# Patient Record
Sex: Male | Born: 1995 | Race: Black or African American | Hispanic: No | Marital: Single | Smoking: Never smoker
Health system: Southern US, Community
[De-identification: ages and names within clinical notes are randomized; demographics above are authoritative.]

## PROBLEM LIST (undated history)

## (undated) HISTORY — PX: SHOULDER SURGERY: SHX246

---

## 2017-06-01 ENCOUNTER — Encounter (HOSPITAL_COMMUNITY): Payer: Self-pay

## 2017-06-01 ENCOUNTER — Emergency Department (HOSPITAL_COMMUNITY)
Admission: EM | Admit: 2017-06-01 | Discharge: 2017-06-01 | Disposition: A | Discharge status: Home / Self Care | Payer: Self-pay | Attending: Emergency Medicine | Admitting: Emergency Medicine

## 2017-06-01 ENCOUNTER — Emergency Department (HOSPITAL_COMMUNITY): Payer: Self-pay

## 2017-06-01 DIAGNOSIS — M545 Low back pain, unspecified: Secondary | ICD-10-CM

## 2017-06-01 DIAGNOSIS — M25561 Pain in right knee: Secondary | ICD-10-CM | POA: Insufficient documentation

## 2017-06-01 NOTE — Discharge Instructions (Addendum)
You can take ibuprofen or Tylenol, available over-the-counter according to label instructions as needed for pain.
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
## 2017-06-01 NOTE — ED Notes (Signed)
Patient transported to X-ray 
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
